# Patient Record
Sex: Male | Born: 1980
Health system: Southern US, Community
[De-identification: ages and names within clinical notes are randomized; demographics above are authoritative.]

## PROBLEM LIST (undated history)

## (undated) DIAGNOSIS — F329 Major depressive disorder, single episode, unspecified: Secondary | ICD-10-CM

## (undated) DIAGNOSIS — F32A Depression, unspecified: Secondary | ICD-10-CM

## (undated) HISTORY — DX: Major depressive disorder, single episode, unspecified: F32.9

## (undated) HISTORY — DX: Depression, unspecified: F32.A

---

## 2017-05-27 ENCOUNTER — Ambulatory Visit (INDEPENDENT_AMBULATORY_CARE_PROVIDER_SITE_OTHER): Payer: BLUE CROSS/BLUE SHIELD | Admitting: Family Medicine

## 2017-05-27 ENCOUNTER — Ambulatory Visit (INDEPENDENT_AMBULATORY_CARE_PROVIDER_SITE_OTHER): Payer: BLUE CROSS/BLUE SHIELD

## 2017-05-27 ENCOUNTER — Encounter: Payer: Self-pay | Admitting: Family Medicine

## 2017-05-27 VITALS — BP 120/90 | HR 72 | Temp 97.9°F | Ht 69.0 in | Wt 162.0 lb

## 2017-05-27 DIAGNOSIS — M545 Low back pain, unspecified: Secondary | ICD-10-CM

## 2017-05-27 DIAGNOSIS — F32A Depression, unspecified: Secondary | ICD-10-CM

## 2017-05-27 DIAGNOSIS — R5383 Other fatigue: Secondary | ICD-10-CM | POA: Diagnosis not present

## 2017-05-27 DIAGNOSIS — F329 Major depressive disorder, single episode, unspecified: Secondary | ICD-10-CM

## 2017-05-27 DIAGNOSIS — G8929 Other chronic pain: Secondary | ICD-10-CM

## 2017-05-27 DIAGNOSIS — F419 Anxiety disorder, unspecified: Secondary | ICD-10-CM

## 2017-05-27 DIAGNOSIS — M25561 Pain in right knee: Secondary | ICD-10-CM

## 2017-05-27 LAB — CBC WITH DIFFERENTIAL/PLATELET
BASOS PCT: 0.4 % (ref 0.0–3.0)
Basophils Absolute: 0 10*3/uL (ref 0.0–0.1)
EOS ABS: 0.1 10*3/uL (ref 0.0–0.7)
EOS PCT: 1.8 % (ref 0.0–5.0)
HEMATOCRIT: 46.8 % (ref 39.0–52.0)
HEMOGLOBIN: 15.6 g/dL (ref 13.0–17.0)
LYMPHS PCT: 24.8 % (ref 12.0–46.0)
Lymphs Abs: 1.9 10*3/uL (ref 0.7–4.0)
MCHC: 33.3 g/dL (ref 30.0–36.0)
MCV: 92 fl (ref 78.0–100.0)
MONOS PCT: 5.8 % (ref 3.0–12.0)
Monocytes Absolute: 0.4 10*3/uL (ref 0.1–1.0)
Neutro Abs: 5.1 10*3/uL (ref 1.4–7.7)
Neutrophils Relative %: 67.2 % (ref 43.0–77.0)
Platelets: 325 10*3/uL (ref 150.0–400.0)
RBC: 5.09 Mil/uL (ref 4.22–5.81)
RDW: 12.9 % (ref 11.5–15.5)
WBC: 7.6 10*3/uL (ref 4.0–10.5)

## 2017-05-27 LAB — COMPREHENSIVE METABOLIC PANEL
ALBUMIN: 4.5 g/dL (ref 3.5–5.2)
ALT: 28 U/L (ref 0–53)
AST: 19 U/L (ref 0–37)
Alkaline Phosphatase: 56 U/L (ref 39–117)
BUN: 19 mg/dL (ref 6–23)
CALCIUM: 10.1 mg/dL (ref 8.4–10.5)
CHLORIDE: 103 meq/L (ref 96–112)
CO2: 34 mEq/L — ABNORMAL HIGH (ref 19–32)
CREATININE: 1.22 mg/dL (ref 0.40–1.50)
GFR: 72 mL/min (ref >60.00)
Glucose, Bld: 103 mg/dL — ABNORMAL HIGH (ref 70–99)
POTASSIUM: 4.3 meq/L (ref 3.5–5.1)
Sodium: 139 mEq/L (ref 135–145)
Total Bilirubin: 0.5 mg/dL (ref 0.2–1.2)
Total Protein: 7.6 g/dL (ref 6.0–8.3)

## 2017-05-27 LAB — VITAMIN B12: VITAMIN B 12: 442 pg/mL (ref 211–911)

## 2017-05-27 LAB — TSH: TSH: 1.01 u[IU]/mL (ref 0.35–4.50)

## 2017-05-27 LAB — VITAMIN D 25 HYDROXY (VIT D DEFICIENCY, FRACTURES): VITD: 27.42 ng/mL — ABNORMAL LOW (ref 30.00–100.00)

## 2017-05-27 NOTE — Patient Instructions (Signed)
Nice to see you. We'll get some lab work today and an x-ray and call you with results. We'll get you set up with physical therapy. We'll get you set up with the therapist. Please try over-the-counter melatonin 0.5 mg nightly 30 minutes before going to bed to see if that helps with your sleep.

## 2017-05-27 NOTE — Progress Notes (Signed)
Tommi Rumps, MD Phone: [phone]  Casey Boyd is a 36 y.o. male who presents today for new patient exam.  Anxiety: Patient notes quite a bit of anxiety. He notes some PTSD and depression as well. He was in the [employer]. He feels like he is in a fog daily. Has trouble sleeping. Not as much energy as he used to have. He has trouble falling asleep at night. No snoring or apnea. He occasionally has nightmares and terrors and sometimes they're related to [employer] activities though other times can be randomly related to aliens or something equally as strange. He's tried exercising and eating right to see if that would help. He notes that has not been beneficial. No SI or HI.  Low back pain: This is a chronic issue. He has seen a chiropractor in the past. Radiate some down his legs in the right lateral aspect of his right leg. Notes a little bit of numbness in the similar distribution. No incontinence, saddle anesthesia, or weakness. When he stretches this improves.  Right knee pain: Patient notes for some time now this will hurt when he bends it and places weight on it. It'll occasionally buckle underneath him. He notes no pain at any other time. No known injury.  Active Ambulatory Problems    Diagnosis Date Noted  . Anxiety and depression 05/28/2017  . Chronic low back pain 05/28/2017  . Right knee pain 05/28/2017   Resolved Ambulatory Problems    Diagnosis Date Noted  . No Resolved Ambulatory Problems   Past Medical History:  Diagnosis Date  . Depression     Family History  Problem Relation Age of Onset  . Drug abuse Mother   . Alcohol abuse Mother   . Arthritis Father   . Hyperlipidemia Father   . Hypertension Father   . Arthritis Maternal Grandmother   . Ovarian cancer Maternal Grandmother   . Arthritis Maternal Grandfather   . Hyperlipidemia Maternal Grandfather   . Heart disease Maternal Grandfather   . Stroke Maternal Grandfather   . Breast cancer Paternal Grandmother     . Stroke Paternal Grandfather   . Heart attack Paternal Grandfather     Social History   Social History  . Marital status: Married    Spouse name: N/A  . Number of children: N/A  . Years of education: N/A   Occupational History  . Not on file.   Social History Main Topics  . Smoking status: Never Smoker  . Smokeless tobacco: Never Used  . Alcohol use No  . Drug use: No  . Sexual activity: Not on file   Other Topics Concern  . Not on file   Social History Narrative  . No narrative on file    ROS  General:  Negative for nexplained weight loss, fever Skin: Negative for new or changing mole, sore that won't heal HEENT: Negative for trouble hearing, trouble seeing, ringing in ears, mouth sores, hoarseness, change in voice, dysphagia. CV:  Negative for chest pain, dyspnea, edema, palpitations Resp: Negative for cough, dyspnea, hemoptysis GI: Negative for nausea, vomiting, diarrhea, constipation, abdominal pain, melena, hematochezia. GU: Negative for dysuria, incontinence, urinary hesitance, hematuria, vaginal or penile discharge, polyuria, sexual difficulty, lumps in testicle or breasts MSK: Negative for muscle cramps or aches,Positive for joint pain or swelling Neuro: Negative for headaches, weakness, numbness, dizziness, passing out/fainting Psych: Positive for anxiety, Negative for depression, memory problems  Objective  Physical Exam Vitals:   05/27/17 1343  BP: 120/90  Pulse: 72  Temp: 97.9 F (36.6 C)  SpO2: 98%    BP Readings from Last 3 Encounters:  05/27/17 120/90   Wt Readings from Last 3 Encounters:  05/27/17 162 lb (73.5 kg)    Physical Exam  Constitutional: No distress.  HENT:  Head: Normocephalic and atraumatic.  Mouth/Throat: Oropharynx is clear and moist. No oropharyngeal exudate.  Eyes: Pupils are equal, round, and reactive to light. Conjunctivae are normal.  Cardiovascular: Normal rate, regular rhythm and normal heart sounds.    Pulmonary/Chest: Effort normal and breath sounds normal.  Abdominal: Soft. Bowel sounds are normal. He exhibits no distension. There is no tenderness. There is no rebound and no guarding.  Musculoskeletal: He exhibits no edema.  No midline spine tenderness, no midline spine step-off, no muscular back tenderness, right knee with no tenderness or swelling, no ligamentous laxity, negative McMurray's  Neurological: He is alert. Gait normal.  5/5 strength bilateral quads, hamstrings, plantar flexion, and dorsiflexion, sensation to light touch intact bilaterally lower extremities  Skin: Skin is warm and dry. He is not diaphoretic.  Psychiatric:  Mood anxious, affect normal     Assessment/Plan:   Anxiety and depression Patient with anxiety and depression. Also possibly PTSD. He does note some fatigue. Suspect this is related to his anxiety leading to sleep difficulty. We'll obtain lab work as outlined below. We'll refer to psychology. Discussed medication though he wanted to hold off on this. He will monitor.  Chronic low back pain Chronic issue. Obtain x-ray. Refer to physical therapy.  Right knee pain Chronic issue. Unsure of cause at this time. We'll obtain an x-ray and consider further evaluation based on this. Offered referral to orthopedics or sports medicine.   Orders Placed This Encounter  Procedures  . DG Lumbar Spine Complete    Standing Status:   Future    Number of Occurrences:   1    Standing Expiration Date:   07/27/2018    Order Specific Question:   Reason for Exam (SYMPTOM  OR DIAGNOSIS REQUIRED)    Answer:   chronic low back pain, radiation down right lateral leg, numbness intermittently in same distribution    Order Specific Question:   Preferred imaging location?    Answer:   [facility] Specific Question:   Radiology Contrast Protocol - do NOT remove file path    Answer:   \\[email or web address]\DXFluoroContrastProtocols.pdf  . DG Knee  Complete 4 Views Right    Standing Status:   Future    Number of Occurrences:   1    Standing Expiration Date:   07/27/2018    Order Specific Question:   Reason for Exam (SYMPTOM  OR DIAGNOSIS REQUIRED)    Answer:   Chronic intermittent right knee pain, feels as though it's giving out on him at times    Order Specific Question:   Preferred imaging location?    Answer:   [facility] Specific Question:   Radiology Contrast Protocol - do NOT remove file path    Answer:   \\[email or web address]\Radiant\DXFluoroContrastProtocols.pdf  . Comp Met (CMET)  . CBC w/Diff  . TSH  . B12  . Vitamin D (25 hydroxy)  . Ambulatory referral to Psychology    Referral Priority:   Routine    Referral Type:   Psychiatric    Referral Reason:   Specialty Services Required    Requested Specialty:   Psychology    Number of Visits Requested:   1  . Ambulatory  referral to Physical Therapy    Referral Priority:   Routine    Referral Type:   Physical Medicine    Referral Reason:   Specialty Services Required    Requested Specialty:   Physical Therapy    Number of Visits Requested:   1    No orders of the defined types were placed in this encounter.    Tommi Rumps, MD [place]

## 2017-05-28 DIAGNOSIS — G8929 Other chronic pain: Secondary | ICD-10-CM | POA: Insufficient documentation

## 2017-05-28 DIAGNOSIS — M545 Low back pain, unspecified: Secondary | ICD-10-CM | POA: Insufficient documentation

## 2017-05-28 DIAGNOSIS — F329 Major depressive disorder, single episode, unspecified: Secondary | ICD-10-CM | POA: Insufficient documentation

## 2017-05-28 DIAGNOSIS — F419 Anxiety disorder, unspecified: Secondary | ICD-10-CM

## 2017-05-28 DIAGNOSIS — M25561 Pain in right knee: Secondary | ICD-10-CM | POA: Insufficient documentation

## 2017-05-28 DIAGNOSIS — F32A Depression, unspecified: Secondary | ICD-10-CM | POA: Insufficient documentation

## 2017-05-28 NOTE — Assessment & Plan Note (Signed)
Chronic issue. Unsure of cause at this time. We'll obtain an x-ray and consider further evaluation based on this. Offered referral to orthopedics or sports medicine.

## 2017-05-28 NOTE — Assessment & Plan Note (Addendum)
Patient with anxiety and depression. Also possibly PTSD. He does note some fatigue. Suspect this is related to his anxiety leading to sleep difficulty. We'll obtain lab work as outlined below. We'll refer to psychology. Discussed medication though he wanted to hold off on this. He will monitor.

## 2017-05-28 NOTE — Assessment & Plan Note (Signed)
Chronic issue. Obtain x-ray. Refer to physical therapy.

## 2017-06-11 ENCOUNTER — Ambulatory Visit: Payer: BLUE CROSS/BLUE SHIELD | Attending: Family Medicine | Admitting: Physical Therapy

## 2017-06-11 DIAGNOSIS — M5442 Lumbago with sciatica, left side: Secondary | ICD-10-CM | POA: Insufficient documentation

## 2017-06-11 DIAGNOSIS — G8929 Other chronic pain: Secondary | ICD-10-CM

## 2017-06-11 NOTE — Patient Instructions (Addendum)
Trunk flexion - mild pain/tightness (same for extension)  L rotation feels mildly more limited than R  Side bending - WNL bilaterally   Hip flexion and knee extension 5/5 but felt some pain in the medial-anterior knee   SLR feels some pressure/pulling in posterior/superior hip (stabbing pain residually on R side)   L side (FADIR/FABER - WNL and no pain, ER/IR - no increased pain and appropriate ROM) -- HS 90-90 (felt some pain on the R side after)

## 2017-06-12 NOTE — Therapy (Signed)
[facility] MEDICINE [address], [place], [address] Phone: [phone]   Fax:  [phone]  Physical Therapy Evaluation  Patient Details  Name: Casey Boyd MRN: [id number] Date of Birth: Feb 24, 1981 Referring Provider: Dr. Birdie Sons  Encounter Date: 06/11/2017      PT End of Session - 06/12/17 1726    Visit Number 1   Number of Visits 9   Date for PT Re-Evaluation 08/07/17   PT Start Time 1600   PT Stop Time 1705   PT Time Calculation (min) 65 min   Activity Tolerance Patient tolerated treatment well   Behavior During Therapy [facility] for tasks assessed/performed      Past Medical History:  Diagnosis Date  . Depression     No past surgical history on file.  There were no vitals filed for this visit.       Subjective Assessment - 06/11/17 1617    Subjective Patient reports roughly 3-4 years ago he was lifting up an object and felt a pop in his lower back, he took the next few days easy and has had episodic L sided low back pain since that time. These flare ups do not seem to have definitive mechanisms, but he reports he recently had a prolonged drive up to [place], afterwards he had trouble with standing/walking for the whole week.  He denies any weight loss, fevers, or bowel bladder changes. He reports his job consists of a lot of sitting and using a computer monitor, he is a Research scientist (life sciences). Reports he does get some pain radiating to L anterior groin/thigh and into his L knee at times.    Limitations Sitting;Standing;Walking   Diagnostic tests None at this point    Patient Stated Goals To figure out why he's having pain/relieve said pain.    Currently in Pain? No/denies            [facility] PT Assessment - 06/12/17 1741      Assessment   Medical Diagnosis Chronic low back pain without sciatica   Referring Provider Dr. Birdie Sons     Precautions   Precautions None     Restrictions   Weight Bearing  Restrictions No     Balance Screen   Has the patient fallen in the past 6 months No     Home Environment   Living Environment Private residence     Prior Function   Level of Independence Independent   Vocation Full time employment   Vocation Requirements Sitting and travel   Leisure Plays with his kids     Cognition   Overall Cognitive Status Within Functional Limits for tasks assessed     Observation/Other Assessments   Modified [place] 42     Sensation   Light Touch Appears Intact      Trunk flexion - mild pain/tightness (same for extension)  L rotation feels mildly more limited than R  Side bending - WNL bilaterally   Hip flexion and knee extension 5/5 but felt some pain in the medial-anterior knee   SLR feels some pressure/pulling in posterior/superior hip (stabbing pain residually on R side)   L side (FADIR/FABER - WNL and no pain, ER/IR - no increased pain and appropriate ROM) -- HS 90-90 (felt some pain on the R side after)    Ely's positive on L, not on R, prone hip flexor stretch - pain on L more than R  Joint mobilizations throughout lumbar spine mildly tender, but not reproductive  of his symptoms   Palpation of gluteals and lumbar spine were not reproductive of his symptoms   TherEx Performed stretching of quadriceps in prone as well as prone hip flexor stretch by therapist x 8 minutes total, patient reported symptomatic recreation of his pain and demonstrated difficulty with standing afterwards.   Performed isometric L hip flexion in sitting x 10 repetitions for 3 sets of 3-5" with submaximal holds -- patient reported complete alleviation of the symptoms he had been feeling.   Educated patient to perform isometrics, then stretch, then isometrics for HEP.       Objective measurements completed on examination: See above findings.                  PT Education - 06/12/17 1733    Education provided Yes   Education Details Use isometrics  as needed for pain control, can use stretching in between periods of isometrics.    Person(s) Educated Patient   Methods Explanation;Demonstration;Handout   Comprehension Verbalized understanding;Returned demonstration             PT Long Term Goals - 06/12/17 1724      PT LONG TERM GOAL #1   Title Patient will report mODI of less than 30% disability to demonstrate improved tolerance for ADLs.    Baseline 42%   Time 6   Period Weeks   Status New   Target Date 07/24/17     PT LONG TERM GOAL #2   Title Patient will report worst pain of no more than 3/10 to demonstrate improved tolerance for ADLs.    Baseline 8/10   Time 6   Period Weeks   Status New   Target Date 07/24/17     PT LONG TERM GOAL #3   Title Patient will report no increase in pain with sitting for periods of at least 1 hour to demonstrate improved tolerance for ADLs.    Baseline Can get severe lasting pain with prolonged sitting like car rides.    Time 6   Period Weeks   Status New   Target Date 07/24/17                Plan - 06/12/17 1727    Clinical Impression Statement Patient presents with deficits in hip flexion and knee extensor mobility on L side relative to his R side, when stretched patient has reproduction of his symptoms and difficulty with standing/ambulating around clinic. Once provided with isometric hip flexion exercise in sitting, he reports significant diminishment of pain/symptoms. For now this appears to be dysfunction of the hip flexor/knee extensor bi-articular musculature, will address with stretching, modalities, and isometrics for pain control initially and progress with mobility and strength as indicated.    Clinical Presentation Unstable   Clinical Decision Making Moderate   Rehab Potential Good   Clinical Impairments Affecting Rehab Potential Prolonged time with this pain/condition, but young and motivated to improve    PT Frequency 2x / week   PT Duration 4 weeks   PT  Treatment/Interventions Dry needling;Manual techniques;Balance training;Therapeutic exercise;Therapeutic activities;Neuromuscular re-education;Gait training;Electrical Stimulation;Cryotherapy;Biofeedback;Traction;Moist Heat;Iontophoresis 4mg /ml Dexamethasone   PT Next Visit Plan Assess tolerance for hip flexor/knee extensor stretching. TDN if appropriate.    PT Home Exercise Plan Isometric hip flexion for pain control, ely's stretch/half kneeling hip flexor stretch in between bouts of isometric.   Consulted and Agree with Plan of Care Patient      Patient will benefit from skilled therapeutic intervention in order to improve the  following deficits and impairments:  Pain, Improper body mechanics, Decreased range of motion, Difficulty walking  Visit Diagnosis: Chronic left-sided low back pain with left-sided sciatica - Plan: PT plan of care cert/re-cert     Problem List Patient Active Problem List   Diagnosis Date Noted  . Anxiety and depression 05/28/2017  . Chronic low back pain 05/28/2017  . Right knee pain 05/28/2017    Alva Garnet PT, DPT, CSCS    06/12/2017, 5:43 PM  [facility] PHYSICAL AND SPORTS MEDICINE [address] [place], [address] Phone: [phone]   Fax:  [phone]  Name: Burdell Peed MRN: [id number] Date of Birth: 1981-08-22

## 2017-06-17 ENCOUNTER — Ambulatory Visit: Payer: BLUE CROSS/BLUE SHIELD | Admitting: Physical Therapy

## 2017-06-17 DIAGNOSIS — G8929 Other chronic pain: Secondary | ICD-10-CM | POA: Diagnosis not present

## 2017-06-17 DIAGNOSIS — M5442 Lumbago with sciatica, left side: Principal | ICD-10-CM

## 2017-06-17 NOTE — Therapy (Signed)
[facility]ORTS MEDICINE [address], [place], [address] Phone: [phone]   Fax:  [phone]  Physical Therapy Treatment  Patient Details  Name: Casey Boyd MRN: [id number] Date of Birth: [birth year] Referring Provider: Dr. Birdie Sons  Encounter Date: 06/17/2017      PT End of Session - 06/17/17 1710    Visit Number 2   Number of Visits 9   Date for PT Re-Evaluation 08/07/17   PT Start Time 1705   PT Stop Time 1729   PT Time Calculation (min) 24 min   Activity Tolerance Patient tolerated treatment well   Behavior During Therapy [facility] for tasks assessed/performed      Past Medical History:  Diagnosis Date  . Depression     No past surgical history on file.  There were no vitals filed for this visit.      Subjective Assessment - 06/17/17 1707    Subjective Patient reports no flare ups, and he believes he has generally improved in pain control. He has completed all of his exercises and found them beneficial, though he is not sure he is doing his quad stretch appropriately.    Limitations Sitting;Standing;Walking   Diagnostic tests None at this point    Patient Stated Goals To figure out why he's having pain/relieve said pain.    Currently in Pain? No/denies      R sided joint mobs UPAs (increased discomfort) x 5 bouts x 45-60" of grade I-II  Observed his technique with Ely's position stretch (required cuing to perform with legs closer to midline, belt on ankle not foot to limit turn out -- patient reported this felt much more beneficial  Sidelying rotational mobilization (felt good afterwards) x 30" for 5 bouts with PT providing overpressure and education on how to complete at home.                             PT Education - 06/17/17 1753    Education provided Yes   Education Details Follow up in 2 weeks, complete sidelying rotational mobilization in addition to HEP.    Person(s)  Educated Patient   Methods Explanation;Demonstration;Handout   Comprehension Returned demonstration;Verbalized understanding             PT Long Term Goals - 06/12/17 1724      PT LONG TERM GOAL #1   Title Patient will report mODI of less than 30% disability to demonstrate improved tolerance for ADLs.    Baseline 42%   Time 6   Period Weeks   Status New   Target Date 07/24/17     PT LONG TERM GOAL #2   Title Patient will report worst pain of no more than 3/10 to demonstrate improved tolerance for ADLs.    Baseline 8/10   Time 6   Period Weeks   Status New   Target Date 07/24/17     PT LONG TERM GOAL #3   Title Patient will report no increase in pain with sitting for periods of at least 1 hour to demonstrate improved tolerance for ADLs.    Baseline Can get severe lasting pain with prolonged sitting like car rides.    Time 6   Period Weeks   Status New   Target Date 07/24/17               Plan - 06/17/17 1725    Clinical Impression Statement Patient has  been improving steadily with no reported flare ups since prior session. Quad stretching still appears to be a beneficial exercise for him, though he required education on modification for this as he was performing with compensations at home. He appears to be making good progress, will follow up in 2 weeks.    Clinical Presentation Stable   Clinical Decision Making Moderate   Rehab Potential Good   Clinical Impairments Affecting Rehab Potential Prolonged time with this pain/condition, but young and motivated to improve    PT Frequency 2x / week   PT Duration 4 weeks   PT Treatment/Interventions Dry needling;Manual techniques;Balance training;Therapeutic exercise;Therapeutic activities;Neuromuscular re-education;Gait training;Electrical Stimulation;Cryotherapy;Biofeedback;Traction;Moist Heat;Iontophoresis 4mg /ml Dexamethasone   PT Next Visit Plan Assess tolerance for hip flexor/knee extensor stretching. TDN if  appropriate.    PT Home Exercise Plan Isometric hip flexion for pain control, ely's stretch/half kneeling hip flexor stretch in between bouts of isometric.   Consulted and Agree with Plan of Care Patient      Patient will benefit from skilled therapeutic intervention in order to improve the following deficits and impairments:  Pain, Improper body mechanics, Decreased range of motion, Difficulty walking  Visit Diagnosis: Chronic left-sided low back pain with left-sided sciatica     Problem List Patient Active Problem List   Diagnosis Date Noted  . Anxiety and depression 05/28/2017  . Chronic low back pain 05/28/2017  . Right knee pain 05/28/2017   Alva GarnetPatrick McNamara PT, DPT, CSCS    06/17/2017, 5:56 PM  [facility] [address] [place], [place], [id number] Phone: [phone]   Fax:  [phone]  Name: Casey Boyd MRN: [id number] Date of Birth: 12/15/1980

## 2017-06-17 NOTE — Patient Instructions (Signed)
R sided joint mobs UPAs (increased discomfort)  Observed his technique with Ely's position stretch (required cuing to perform with legs closer to midline, belt on ankle not foot to limit turn out   Sidelying rotational mobilization (felt good afterwards)

## 2017-06-24 ENCOUNTER — Ambulatory Visit: Payer: BLUE CROSS/BLUE SHIELD | Admitting: Physical Therapy

## 2017-06-26 ENCOUNTER — Ambulatory Visit: Payer: BLUE CROSS/BLUE SHIELD | Admitting: Physical Therapy

## 2017-06-26 DIAGNOSIS — M5442 Lumbago with sciatica, left side: Secondary | ICD-10-CM | POA: Diagnosis not present

## 2017-06-26 DIAGNOSIS — G8929 Other chronic pain: Secondary | ICD-10-CM | POA: Diagnosis not present

## 2017-06-26 NOTE — Therapy (Signed)
[facility]INE [address] [place], [address] Phone: [phone]   Fax:  [phone]  Physical Therapy Treatment  Patient Details  Name: Casey Boyd MRN: [id number] Date of Birth: 1981/09/25 Referring Provider: Dr. Birdie Sons  Encounter Date: 06/26/2017      PT End of Session - 06/26/17 1804    Visit Number 3   Number of Visits 9   Date for PT Re-Evaluation 08/07/17   PT Start Time 1705   PT Stop Time 1750   PT Time Calculation (min) 45 min   Activity Tolerance Patient tolerated treatment well   Behavior During Therapy [facility] for tasks assessed/performed      Past Medical History:  Diagnosis Date  . Depression     No past surgical history on file.  There were no vitals filed for this visit.      Subjective Assessment - 06/26/17 1709    Subjective Patient reports he has had increase in tightness/pain in his lower back which has not responded to stretching since last PT session. It has eventually started to calm down, but has been easily aggravated by any lifting activities.    Limitations Sitting;Standing;Walking   Diagnostic tests None at this point    Patient Stated Goals To figure out why he's having pain/relieve said pain.    Currently in Pain? No/denies        L rotation limited by ~33% and feels tight relative to R rotation   Flexion to the floor and extension mildly to moderately reproduces symptoms in distal R spine (lumbar area).   Standing cable rotations x 15 per side with 5# (mild aggravation going L to R)  With SLDL on L LE with medial rotations began to aggravate symptoms on R PSIS area   Performed TDN over L4/L5 and L5/S1 lumbar extensor musculature, felt significant spasming in the muscle, unable to get to lamina, pistoned x 15", patient reported relief with SLDL and medial rotation (not as sharp, more dull)   Patient performed standing lat and seated lat/tricep stretch, which  did not seem to specifically target his area of discomfort. Attempted at palpation of hip flexors anteriorly with no reported discomfort.   Educated patient on motor control exercises including standing fire hydrant isometrics x 15" per side, single leg bridging x 8 (initially painful on R side, but alleviated with repetitions), and standing isometric hip abduction x 15".   His position or stretch of comfort is seated with 1 leg off table, one leg on, rotatiing his shoulders medially                           PT Education - 06/26/17 1803    Education provided Yes   Education Details Appears he is getting spasming from poor motor control of rotational fibers of lumbar spine vs hip.    Person(s) Educated Patient   Methods Explanation;Demonstration;Handout   Comprehension Verbalized understanding;Returned demonstration             PT Long Term Goals - 06/12/17 1724      PT LONG TERM GOAL #1   Title Patient will report mODI of less than 30% disability to demonstrate improved tolerance for ADLs.    Baseline 42%   Time 6   Period Weeks   Status New   Target Date 07/24/17     PT LONG TERM GOAL #2   Title Patient will report worst pain  of no more than 3/10 to demonstrate improved tolerance for ADLs.    Baseline 8/10   Time 6   Period Weeks   Status New   Target Date 07/24/17     PT LONG TERM GOAL #3   Title Patient will report no increase in pain with sitting for periods of at least 1 hour to demonstrate improved tolerance for ADLs.    Baseline Can get severe lasting pain with prolonged sitting like car rides.    Time 6   Period Weeks   Status New   Target Date 07/24/17               Plan - 06/26/17 1804    Clinical Impression Statement Patient had acute flare up after last session and appears to have some difficulty with motor control with lumbo-pelvic rotations, as these consistently increase his R lumbar paraspinal/flank pain. Hip flexor  palpation and stretching did not seem to alleviate symptoms, and appears QL may be more responsible. He did respond well to TDN this date, with significant spasms noted. He may benefit from fine tuning motor control or lumbopelvic motor control.    Clinical Presentation Stable   Clinical Decision Making Moderate   Rehab Potential Good   Clinical Impairments Affecting Rehab Potential Prolonged time with this pain/condition, but young and motivated to improve    PT Frequency 2x / week   PT Duration 4 weeks   PT Treatment/Interventions Dry needling;Manual techniques;Balance training;Therapeutic exercise;Therapeutic activities;Neuromuscular re-education;Gait training;Electrical Stimulation;Cryotherapy;Biofeedback;Traction;Moist Heat;Iontophoresis /ml Dexamethasone   PT Next Visit Plan Assess tolerance for hip flexor/knee extensor stretching. TDN if appropriate.    PT Home Exercise Plan Isometric hip flexion for pain control, ely's stretch/half kneeling hip flexor stretch in between bouts of isometric.   Consulted and Agree with Plan of Care Patient      Patient will benefit from skilled therapeutic intervention in order to improve the following deficits and impairments:  Pain, Improper body mechanics, Decreased range of motion, Difficulty walking  Visit Diagnosis: Chronic left-sided low back pain with left-sided sciatica     Problem List Patient Active Problem List   Diagnosis Date Noted  . Anxiety and depression 05/28/2017  . Chronic low back pain 05/28/2017  . Right knee pain 05/28/2017   Alva Garnet PT, DPT, CSCS    06/26/2017, 6:35 PM  [facility] PHYSICAL AND SPORTS MEDICINE [address] [place], [address] Phone: [phone]   Fax:  [phone]  Name: Kelcy Baeten MRN: [id number] Date of Birth: 01-28-1981

## 2017-06-26 NOTE — Patient Instructions (Signed)
L rotation limited by ~33% and feels tight relative to R rotation   Flexion to the floor and extension mildly to moderately reproduces symptoms in distal R spine (lumbar area).   Standing cable rotations x 15 per side with 5# (mild aggravation going L to R)  With SLDL on L LE with medial rotations began to aggravate symptoms on R PSIS area   Performed TDN over L4/L5 and L5/S1 lumbar extensor musculature, felt significant spasming in the muscle, unable to get to lamina, pistoned x 15", patient reported relief with SLDL and medial rotation (not as sharp, more dull)   Patient performed standing lat and seated lat/tricep stretch, which did not seem to specifically target his area of discomfort. Attempted at palpation of hip flexors anteriorly with no reported discomfort.   Educated patient on motor control exercises including standing fire hydrant isometrics x 15" per side, single leg bridging x 8 (initially painful on R side, but alleviated with repetitions), and standing isometric hip abduction x 15".   His position or stretch of comfort is seated with 1 leg off table, one leg on, rotatiing his shoulders medially

## 2017-07-09 ENCOUNTER — Ambulatory Visit: Payer: BLUE CROSS/BLUE SHIELD | Attending: Family Medicine | Admitting: Physical Therapy

## 2017-09-10 ENCOUNTER — Ambulatory Visit: Payer: BLUE CROSS/BLUE SHIELD | Admitting: Family Medicine

## 2017-12-09 ENCOUNTER — Emergency Department: Payer: BLUE CROSS/BLUE SHIELD

## 2017-12-09 ENCOUNTER — Encounter: Payer: Self-pay | Admitting: Emergency Medicine

## 2017-12-09 ENCOUNTER — Ambulatory Visit: Payer: Self-pay | Admitting: *Deleted

## 2017-12-09 ENCOUNTER — Emergency Department
Admission: EM | Admit: 2017-12-09 | Discharge: 2017-12-09 | Disposition: A | Discharge status: Home / Self Care | Payer: BLUE CROSS/BLUE SHIELD | Attending: Emergency Medicine | Admitting: Emergency Medicine

## 2017-12-09 DIAGNOSIS — R202 Paresthesia of skin: Secondary | ICD-10-CM | POA: Insufficient documentation

## 2017-12-09 DIAGNOSIS — R200 Anesthesia of skin: Secondary | ICD-10-CM | POA: Diagnosis not present

## 2017-12-09 LAB — URINE DRUG SCREEN, QUALITATIVE (ARMC ONLY)
AMPHETAMINES, UR SCREEN: NOT DETECTED
BENZODIAZEPINE, UR SCRN: NOT DETECTED
Barbiturates, Ur Screen: NOT DETECTED
Cannabinoid 50 Ng, Ur ~~LOC~~: NOT DETECTED
Cocaine Metabolite,Ur ~~LOC~~: NOT DETECTED
MDMA (Ecstasy)Ur Screen: NOT DETECTED
Methadone Scn, Ur: NOT DETECTED
OPIATE, UR SCREEN: NOT DETECTED
Phencyclidine (PCP) Ur S: NOT DETECTED
Tricyclic, Ur Screen: NOT DETECTED

## 2017-12-09 LAB — URINALYSIS, COMPLETE (UACMP) WITH MICROSCOPIC
BACTERIA UA: NONE SEEN
Bilirubin Urine: NEGATIVE
Glucose, UA: NEGATIVE mg/dL
Hgb urine dipstick: NEGATIVE
Ketones, ur: NEGATIVE mg/dL
Leukocytes, UA: NEGATIVE
Nitrite: NEGATIVE
PH: 7 (ref 5.0–8.0)
Protein, ur: NEGATIVE mg/dL
RBC / HPF: NONE SEEN RBC/hpf (ref 0–5)
SPECIFIC GRAVITY, URINE: 1.016 (ref 1.005–1.030)
SQUAMOUS EPITHELIAL / LPF: NONE SEEN
WBC UA: NONE SEEN WBC/hpf (ref 0–5)

## 2017-12-09 LAB — CBC WITH DIFFERENTIAL/PLATELET
BASOS PCT: 1 %
Basophils Absolute: 0.1 10*3/uL (ref 0–0.1)
Eosinophils Absolute: 0.2 10*3/uL (ref 0–0.7)
Eosinophils Relative: 3 %
HEMATOCRIT: 45.8 % (ref 40.0–52.0)
HEMOGLOBIN: 15.5 g/dL (ref 13.0–18.0)
Lymphocytes Relative: 23 %
Lymphs Abs: 1.9 10*3/uL (ref 1.0–3.6)
MCH: 30.1 pg (ref 26.0–34.0)
MCHC: 33.8 g/dL (ref 32.0–36.0)
MCV: 88.9 fL (ref 80.0–100.0)
MONO ABS: 0.4 10*3/uL (ref 0.2–1.0)
Monocytes Relative: 5 %
NEUTROS ABS: 5.5 10*3/uL (ref 1.4–6.5)
NEUTROS PCT: 68 %
Platelets: 299 10*3/uL (ref 150–440)
RBC: 5.15 MIL/uL (ref 4.40–5.90)
RDW: 13.7 % (ref 11.5–14.5)
WBC: 8.2 10*3/uL (ref 3.8–10.6)

## 2017-12-09 LAB — BASIC METABOLIC PANEL
ANION GAP: 7 (ref 5–15)
BUN: 16 mg/dL (ref 6–20)
CALCIUM: 9.7 mg/dL (ref 8.9–10.3)
CO2: 30 mmol/L (ref 22–32)
Chloride: 104 mmol/L (ref 101–111)
Creatinine, Ser: 1.12 mg/dL (ref 0.61–1.24)
Glucose, Bld: 95 mg/dL (ref 65–99)
Potassium: 4.3 mmol/L (ref 3.5–5.1)
Sodium: 141 mmol/L (ref 135–145)

## 2017-12-09 LAB — MAGNESIUM: MAGNESIUM: 2.1 mg/dL (ref 1.7–2.4)

## 2017-12-09 LAB — PHOSPHORUS: PHOSPHORUS: 3.5 mg/dL (ref 2.5–4.6)

## 2017-12-09 NOTE — ED Triage Notes (Signed)
Pt presents with right sided facial numbness and tingling sensation lasted about 3-5 min but has resolved now. Denies any changes with speech or vision. NAD.

## 2017-12-09 NOTE — ED Notes (Signed)
Pt in MRI at this time 

## 2017-12-09 NOTE — Telephone Encounter (Signed)
Noted. It appears the patient is in the ED now.  

## 2017-12-09 NOTE — Discharge Instructions (Signed)
If you have any numbness or weakness, difficulty speaking or talking, trouble with concentration or if your worse in any way, return to the emergency department right away.  Otherwise, follow closely with primary care and neurology.  We are referring you to a neurologist, however, it is not mandatory that you see this neurologist.  He may also consider calling [facility] as well if they can get you in faster.

## 2017-12-09 NOTE — Telephone Encounter (Signed)
Patient is calling with concerns of right facial,arm and leg tingling that lasted 3-5 minutes today. Patient states he was sitting at his desk and this occurred which was very strange. He states he could feel pins and needle type tingling in his nose. He has never had this happen before. All other neurologic questions were negative- but because of the sudden onset and disappearance and the office was contacted. Per recommendation of flow coordinator- patient is advised to go to ED for evaluation. Call to patient and he is going to go.  Reason for Disposition . [1] Numbness (i.e., loss of sensation) of the face, arm / hand, or leg / foot on one side of the body AND [2] sudden onset AND [3] brief (now gone)  Answer Assessment - Initial Assessment Questions 1. SYMPTOM: "What is the main symptom you are concerned about?" (e.g., weakness, numbness)     Facial tingling on right 2. ONSET: "When did this start?" (minutes, hours, days; while sleeping)     1/2 hour ago 3. LAST NORMAL: "When was the last time you were normal (no symptoms)?"     Lasted 3-5 minutes 4. PATTERN "Does this come and go, or has it been constant since it started?"  "Is it present now?"     Only occurance 5. CARDIAC SYMPTOMS: "Have you had any of the following symptoms: chest pain, difficulty breathing, palpitations?"     no 6. NEUROLOGIC SYMPTOMS: "Have you had any of the following symptoms: headache, dizziness, vision loss, double vision, changes in speech, unsteady on your feet?"     no 7. OTHER SYMPTOMS: "Do you have any other symptoms?"     no 8. PREGNANCY: "Is there any chance you are pregnant?" "When was your last menstrual period?"     n/a  Protocols used: NEUROLOGIC DEFICIT-A-AH

## 2017-12-09 NOTE — ED Notes (Signed)
MD at bedside. 

## 2017-12-09 NOTE — ED Provider Notes (Addendum)
[facility] Emergency Department Provider Note  ____________________________________________   I have reviewed the triage vital signs and the nursing notes. Where available I have reviewed prior notes and, if possible and indicated, outside hospital notes.    HISTORY  Chief Complaint Numbness    HPI Casey Boyd is a 37 y.o. male who presents here today complaining of having a few minutes, possibly, of tingling on the right side of his body.  Included the right face right arm right leg.  He states he was sitting somewhat slumped over to the right in the chair he thought maybe he was just sitting awkwardly he stood up, the symptoms persisted for a few seconds or perhaps a minute after that and went away.  This was at 4:00.  He has had no symptoms since that time.  He has done multiple different Google searches on the Internet about mini strokes since this time.  CT scan from the waiting room is negative.  He has had no headache, no chest pain or shortness of breath no nausea vertigo symptoms or other complaints.  He denies being weak at all.  He is completely at his baseline at this time.  He is a young healthy 37 year old male who exercises, no recent trauma no history of drug abuse and he states "I freaked out" after he had this tingling.     Past Medical History:  Diagnosis Date  . Depression     Patient Active Problem List   Diagnosis Date Noted  . Anxiety and depression 05/28/2017  . Chronic low back pain 05/28/2017  . Right knee pain 05/28/2017    History reviewed. No pertinent surgical history.  Prior to Admission medications   Not on File    Allergies Patient has no known allergies.  Family History  Problem Relation Age of Onset  . Drug abuse Mother   . Alcohol abuse Mother   . Arthritis Father   . Hyperlipidemia Father   . Hypertension Father   . Arthritis Maternal Grandmother   . Ovarian cancer Maternal Grandmother   . Arthritis  Maternal Grandfather   . Hyperlipidemia Maternal Grandfather   . Heart disease Maternal Grandfather   . Stroke Maternal Grandfather   . Breast cancer Paternal Grandmother   . Stroke Paternal Grandfather   . Heart attack Paternal Grandfather     Social History Social History   Tobacco Use  . Smoking status: Never Smoker  . Smokeless tobacco: Never Used  Substance Use Topics  . Alcohol use: No  . Drug use: No    Review of Systems Constitutional: No fever/chills Eyes: No visual changes. ENT: No sore throat. No stiff neck no neck pain Cardiovascular: Denies chest pain. Respiratory: Denies shortness of breath. Gastrointestinal:   no vomiting.  No diarrhea.  No constipation. Genitourinary: Negative for dysuria. Musculoskeletal: Negative lower extremity swelling Skin: Negative for rash. Neurological: Negative for severe headaches, focal weakness    ____________________________________________   PHYSICAL EXAM:  VITAL SIGNS: ED Triage Vitals  Enc Vitals Group     BP 12/09/17 1727 (!) 149/95     Pulse Rate 12/09/17 1727 65     Resp 12/09/17 1727 20     Temp 12/09/17 1727 98.5 F (36.9 C)     Temp Source 12/09/17 1727 Oral     SpO2 12/09/17 1727 99 %     Weight 12/09/17 1730 162 lb (73.5 kg)     Height --      Head Circumference --  Peak Flow --      Pain Score --      Pain Loc --      Pain Edu? --      Excl. in GC? --     Constitutional: Alert and oriented. Well appearing and in no acute distress. Eyes: Conjunctivae are normal Head: Atraumatic HEENT: No congestion/rhinnorhea. Mucous membranes are moist.  Oropharynx non-erythematous Neck:   Nontender with no meningismus, no masses, no stridor Cardiovascular: Normal rate, regular rhythm. Grossly normal heart sounds.  Good peripheral circulation. Respiratory: Normal respiratory effort.  No retractions. Lungs CTAB. Abdominal: Soft and nontender. No distention. No guarding no rebound Back:  There is no focal  tenderness or step off.  there is no midline tenderness there are no lesions noted. there is no CVA tenderness Musculoskeletal: No lower extremity tenderness, no upper extremity tenderness. No joint effusions, no DVT signs strong distal pulses no edema Neurologic: Cranial nerves II through XII are grossly intact 5 out of 5 strength bilateral upper and lower extremity. Finger to nose within normal limits heel to shin within normal limits, speech is normal with no word finding difficulty or dysarthria, reflexes symmetric, pupils are equally round and reactive to light, there is no pronator drift, sensation is normal, vision is intact to confrontation, gait is deferred, there is no nystagmus, normal neurologic exam.  Skin:  Skin is warm, dry and intact. No rash noted. Psychiatric: Mood and affect are anxious. Speech and behavior are normal.  ____________________________________________   LABS (all labs ordered are listed, but only abnormal results are displayed)  Labs Reviewed - No data to display  Pertinent labs  results that were available during my care of the patient were reviewed by me and considered in my medical decision making (see chart for details). ____________________________________________  EKG  I personally interpreted any EKGs ordered by me or triage Normal sinus rhythm, rate 74 bpm no acute ischemic changes ____________________________________________  RADIOLOGY  Pertinent labs & imaging results that were available during my care of the patient were reviewed by me and considered in my medical decision making (see chart for details). If possible, patient and/or family made aware of any abnormal findings.  Ct Head Wo Contrast  Result Date: 12/09/2017 CLINICAL DATA:  Numbness or tingling in the right face, now resolved. No reported injury. EXAM: CT HEAD WITHOUT CONTRAST TECHNIQUE: Contiguous axial images were obtained from the base of the skull through the vertex without  intravenous contrast. COMPARISON:  None. FINDINGS: Brain: No evidence of parenchymal hemorrhage or extra-axial fluid collection. No mass lesion, mass effect, or midline shift. No CT evidence of acute infarction. Cerebral volume is age appropriate. No ventriculomegaly. Vascular: No acute abnormality. Skull: No evidence of calvarial fracture. Sinuses/Orbits: The visualized paranasal sinuses are essentially clear. Other:  The mastoid air cells are unopacified. IMPRESSION: Negative head CT.  No evidence of acute intracranial abnormality. Electronically Signed   By: Delbert Phenix M.D.   On: 12/09/2017 17:54   ______________________________________  Personally reviewed CT scan.  ______    PROCEDURES  Procedure(s) performed: None  Procedures  Critical Care performed: None  ____________________________________________   INITIAL IMPRESSION / ASSESSMENT AND PLAN / ED COURSE  Pertinent labs & imaging results that were available during my care of the patient were reviewed by me and considered in my medical decision making (see chart for details).  Patient here with concern for a very brief episode of right-sided tingling, differential includes partial seizure, TIA, anxiety positional issue, etc.  I did discuss with Dr. Jerrell Belfast, of neurology.  He does recommend a MRI and if negative he feels the patient can safely go home with close outpatient neurologic follow-up.  He also like blood work.  This is not unreasonable.  CT is negative and my anticipation is that the MRI will be - as well.  Serial neurologic exams in the emergency department are reassuring.  Patient is very much in agreement with this plan   ----------------------------------------- 9:08 PM on 12/09/2017 -----------------------------------------  Patient in no acute distress, ambulating to the department neurologically intact awaiting MRI results if negative as hoped and expected, we will get him home with close outpatient follow-up with  neurology in the next few days.     ____________________________________________   FINAL CLINICAL IMPRESSION(S) / ED DIAGNOSES  Final diagnoses:  None      This chart was dictated using voice recognition software.  Despite best efforts to proofread,  errors can occur which can change meaning.      Jeanmarie Plant, MD 12/09/17 1933    Jeanmarie Plant, MD 12/09/17 2108

## 2017-12-09 NOTE — Telephone Encounter (Signed)
Patient going to ED for evaluation.

## 2017-12-09 NOTE — ED Notes (Signed)
Pt. Returned to tx. room in stable condition with no acute changes since departure from unit for scans.   

## 2018-03-10 DIAGNOSIS — M9904 Segmental and somatic dysfunction of sacral region: Secondary | ICD-10-CM | POA: Diagnosis not present

## 2018-03-10 DIAGNOSIS — M5127 Other intervertebral disc displacement, lumbosacral region: Secondary | ICD-10-CM | POA: Diagnosis not present

## 2018-03-10 DIAGNOSIS — M9903 Segmental and somatic dysfunction of lumbar region: Secondary | ICD-10-CM | POA: Diagnosis not present

## 2018-07-05 IMAGING — MR MR HEAD W/O CM
10 series · 48 of 48 positions shown · non-contrast
Comparison: 12/09/2017 CT head

CLINICAL DATA: 37 y/o M; right-sided facial numbness and tingling
lasting 3-5 minutes.

EXAM:
MRI HEAD WITHOUT CONTRAST
TECHNIQUE: Multiplanar, multiecho pulse sequences of the brain and surrounding
structures were obtained without intravenous contrast.

[Series 2: T1 · sagittal · 5.0mm · 0.45mm/px · 3 of 29 slices shown (1 of 2)]
[im 1/29]
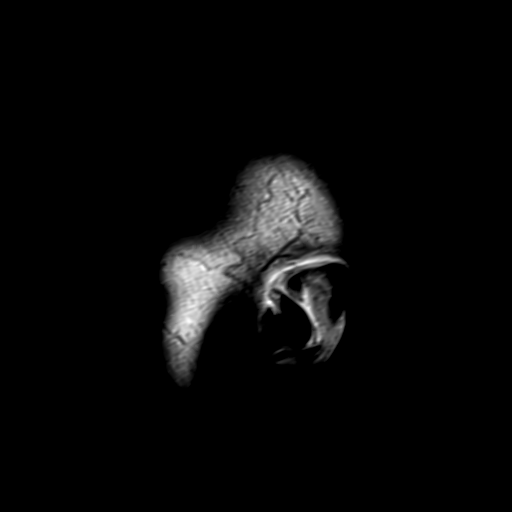
[im 15/29]
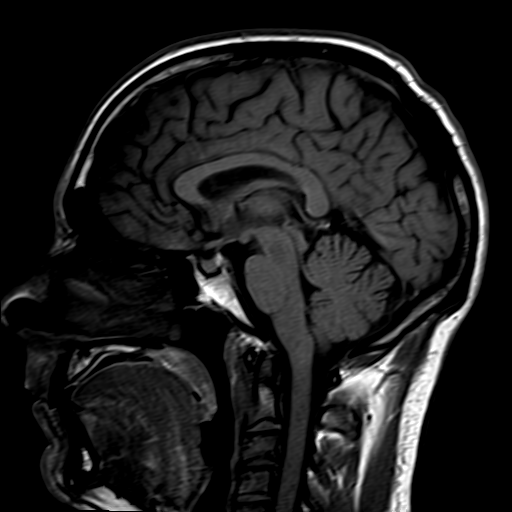
[im 29/29]
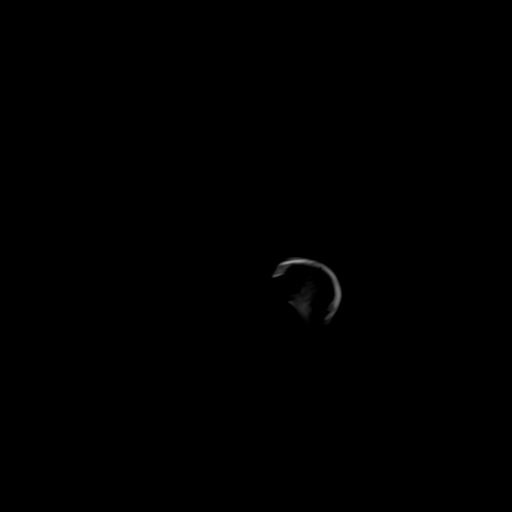

[Series 4: DWI · axial · 3.0mm · 1.80mm/px · z∈[-55,+106]mm · 4 of 50 slices shown (1 of 2)]
[im 1/50]
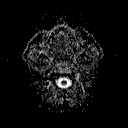
[im 17/50]
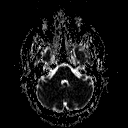
[im 33/50]
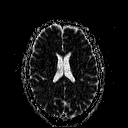
[im 50/50]
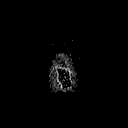

[Series 6: DWI · coronal · 3.0mm · 1.80mm/px · 4 of 48 slices shown (2 of 2)]
[im 1/48]
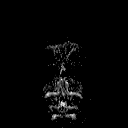
[im 16/48]
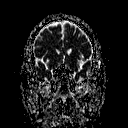
[im 32/48]
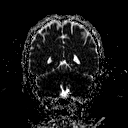
[im 48/48]
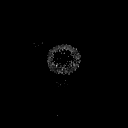

[Series 7: T2 · axial · 5.0mm · 0.60mm/px · z∈[-52,+103]mm · 2 of 25 slices shown (1 of 3)]
[im 1/25]
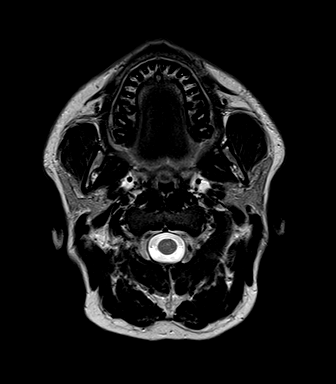
[im 25/25]
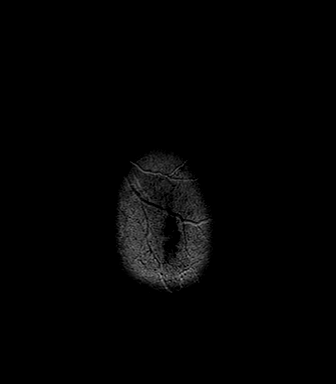

[Series 8: FLAIR · axial · 3.0mm · 0.45mm/px · z∈[-52,+103]mm · 5 of 53 slices shown]
[im 1/53]
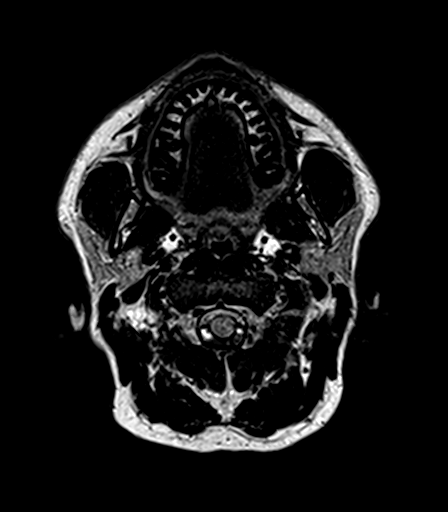
[im 14/53]
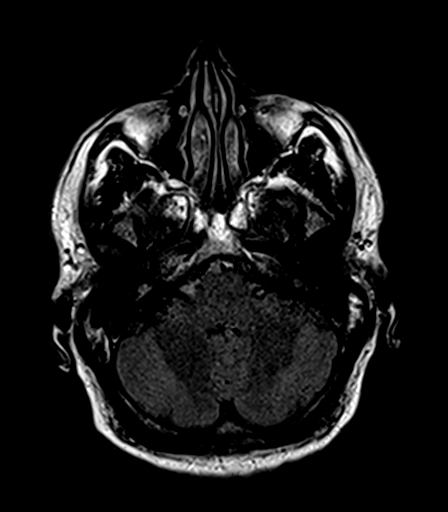
[im 27/53]
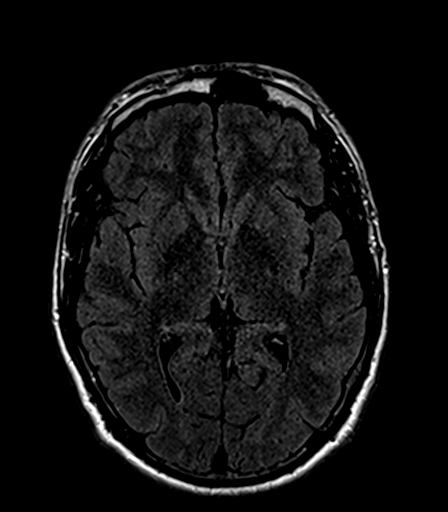
[im 40/53]
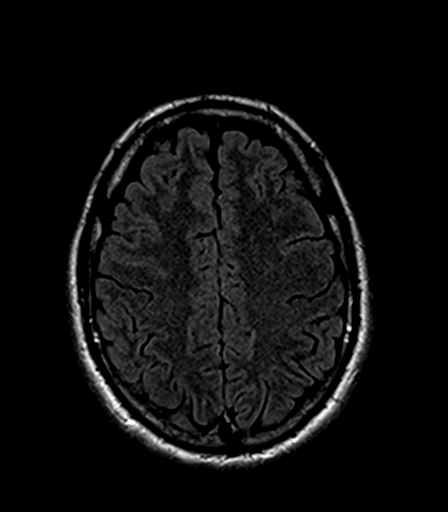
[im 53/53]
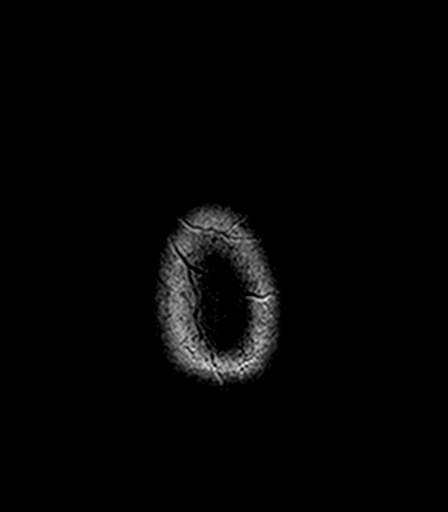

[Series 9: T2 · axial · 5.0mm · 0.45mm/px · z∈[-52,+103]mm · 2 of 25 slices shown (2 of 3)]
[im 1/25]
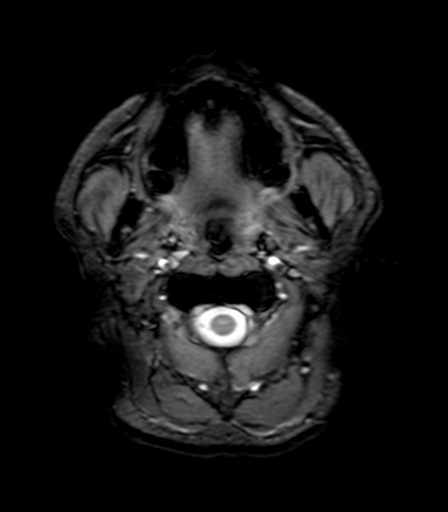
[im 25/25]
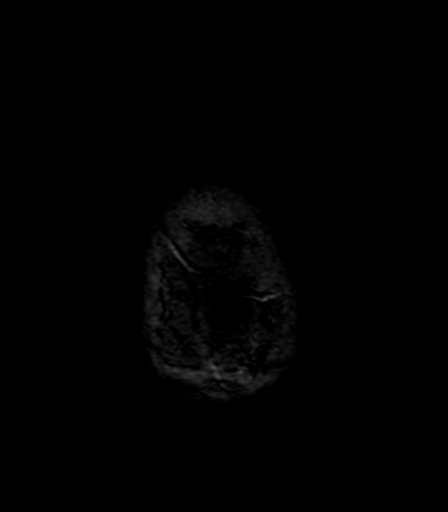

[Series 10: T1 · axial · 1.0mm · 0.77mm/px · z∈[-61,+113]mm · 16 of 176 slices shown (2 of 2)]
[im 1/176]
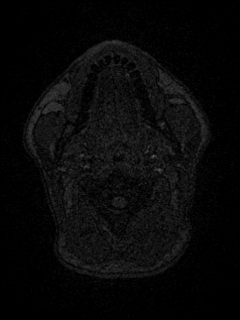
[im 12/176]
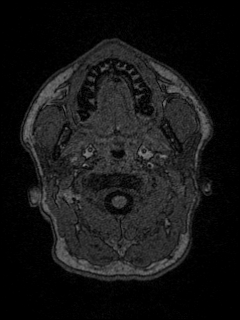
[im 24/176]
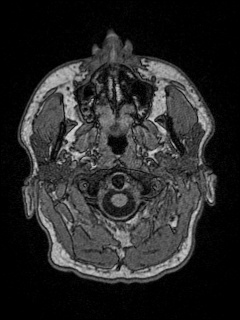
[im 36/176]
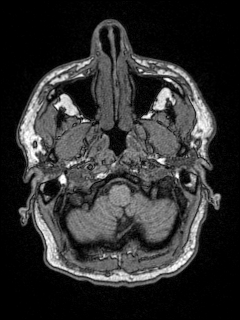
[im 47/176]
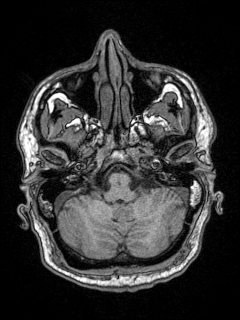
[im 59/176]
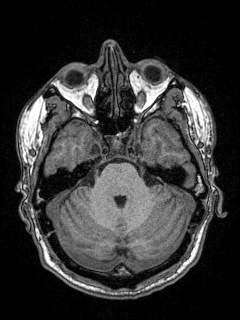
[im 71/176]
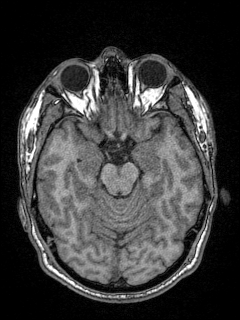
[im 82/176]
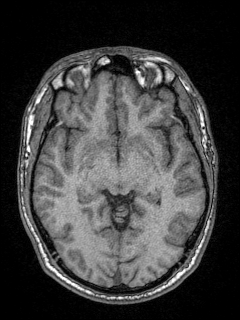
[im 94/176]
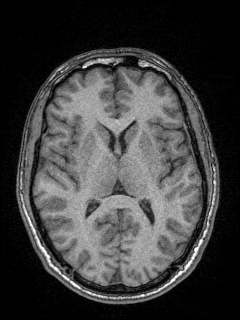
[im 106/176]
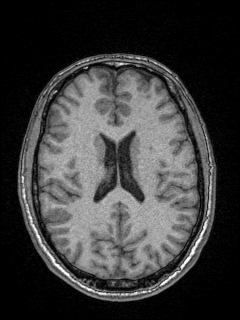
[im 117/176]
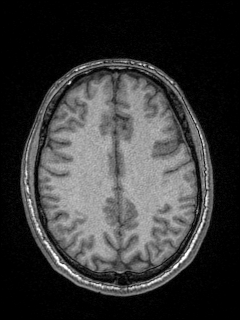
[im 129/176]
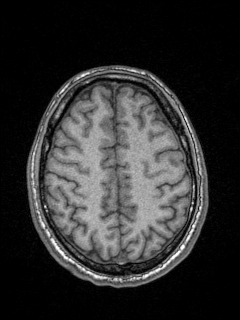
[im 141/176]
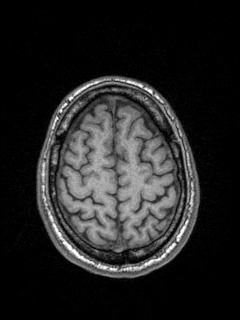
[im 152/176]
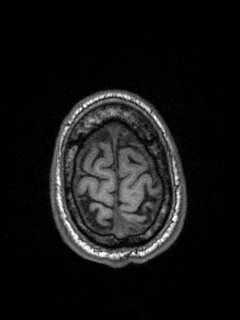
[im 164/176]
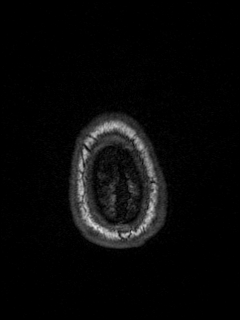
[im 176/176]
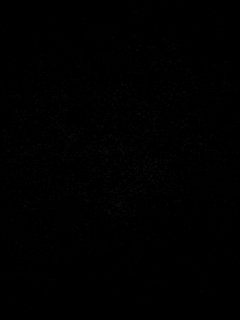

[Series 11: T2 · coronal · 5.0mm · 0.49mm/px · 3 of 31 slices shown (3 of 3)]
[im 1/31]
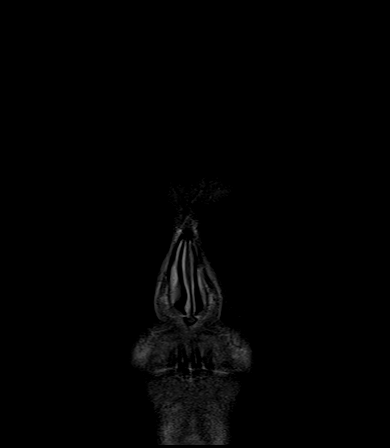
[im 16/31]
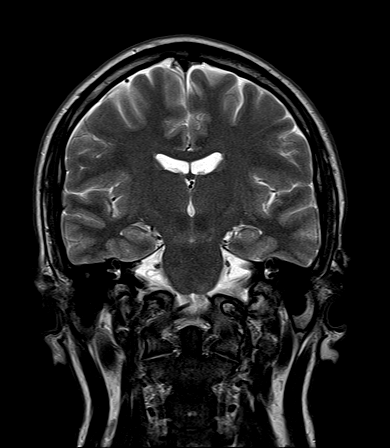
[im 31/31]
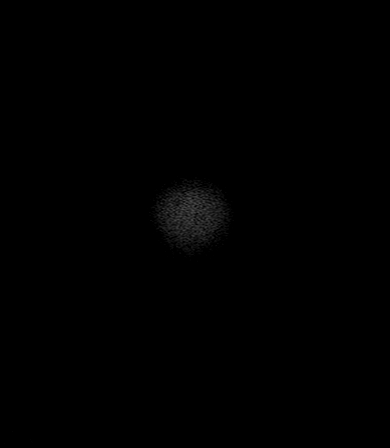

[Series 100: ax (id) · axial · 3.0mm · 1.80mm/px · z∈[-55,+106]mm · 5 of 55 slices shown]
[im 1/55]
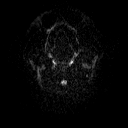
[im 14/55]
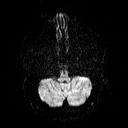
[im 28/55]
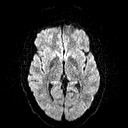
[im 41/55]
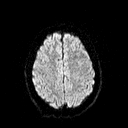
[im 55/55]
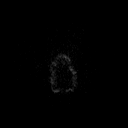

[Series 101: cor (id) · coronal · 3.0mm · 1.80mm/px · 4 of 49 slices shown]
[im 1/49]
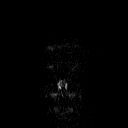
[im 17/49]
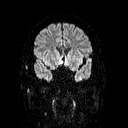
[im 33/49]
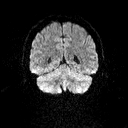
[im 49/49]
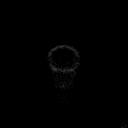

[48 of 48 positions shown; findings below may reference images not displayed]

FINDINGS: Brain: No acute infarction, hemorrhage, hydrocephalus, extra-axial
collection or mass lesion.

Vascular: Normal flow voids.

Skull and upper cervical spine: Normal marrow signal.

Sinuses/Orbits: Negative.

Other: None.
IMPRESSION: Normal MRI of the brain.

By: Rtoyota Joshjax M.D.

## 2019-03-15 ENCOUNTER — Telehealth: Payer: Self-pay | Admitting: Family Medicine

## 2019-03-15 NOTE — Telephone Encounter (Signed)
Appointment Request From: Casey Boyd  With Provider: Tommi Rumps, MD [facility]]  Preferred Date Range: 03/15/2019 - 03/19/2019  Preferred Times: Any time  Reason for visit: Office Visit  Comments: PTSD, Anxiety, Lowerback and right leg pain   --Lm twice to call off and schedule appt

## 2019-03-16 ENCOUNTER — Ambulatory Visit (INDEPENDENT_AMBULATORY_CARE_PROVIDER_SITE_OTHER): Payer: BLUE CROSS/BLUE SHIELD | Admitting: Family Medicine

## 2019-03-16 ENCOUNTER — Other Ambulatory Visit: Payer: Self-pay

## 2019-03-16 DIAGNOSIS — F329 Major depressive disorder, single episode, unspecified: Secondary | ICD-10-CM

## 2019-03-16 DIAGNOSIS — F32A Depression, unspecified: Secondary | ICD-10-CM

## 2019-03-16 DIAGNOSIS — G8929 Other chronic pain: Secondary | ICD-10-CM

## 2019-03-16 DIAGNOSIS — M545 Low back pain: Secondary | ICD-10-CM

## 2019-03-16 DIAGNOSIS — F419 Anxiety disorder, unspecified: Secondary | ICD-10-CM

## 2019-03-16 DIAGNOSIS — F4310 Post-traumatic stress disorder, unspecified: Secondary | ICD-10-CM

## 2019-03-16 MED ORDER — SERTRALINE HCL 50 MG PO TABS: 50.0000 mg | ORAL_TABLET | Freq: Every day | ORAL | 3 refills | Status: AC

## 2019-03-16 NOTE — Progress Notes (Signed)
Virtual Visit via video Note  This visit type was conducted due to national recommendations for restrictions regarding the COVID-19 pandemic (e.g. social distancing).  This format is felt to be most appropriate for this patient at this time.  All issues noted in this document were discussed and addressed.  No physical exam was performed (except for noted visual exam findings with Video Visits).   I connected with Casey Boyd today at  2:15 PM EDT by a video enabled telemedicine application and verified that I am speaking with the correct person using two identifiers. Location patient: hotel Location provider: work Persons participating in the virtual visit: patient, provider  I discussed the limitations, risks, security and privacy concerns of performing an evaluation and management service by telephone and the availability of in person appointments. I also discussed with the patient that there may be a patient responsible charge related to this service. The patient expressed understanding and agreed to proceed.   Reason for visit: Follow-up.  HPI: Depression/anxiety/PTSD: This issue seems to have worsened since last discussed.  He does note it waxes and wanes and at times he will be good for a period of time though if something happens that gets him off then he will have periods where this is worsened.  He does note anxiety and depression and feels he may have PTSD.  [place]  He does note waking up in the middle the night to the faintest noise and being hypervigilant when that occurs.  He does have flashbacks that do not occur daily.  The hypervigilance does occur daily.  He has very lucid dreams.  He notes all of this has been going on for several years.  He tries to avoid any stimuli that will bring this issue to the forefront of his mind.  He does note he has been drinking more than he should.  He notes typically there are 3 to 4 days a week where he will  drink and when asked about a quantity he notes "I can drink a lot."  He does note there are times where he will go several weeks without drinking anything.  He notes he does drink to dull the issues that he is having.  He notes when he has quit drinking in the past for several weeks he has had no withdrawal symptoms.  He has not done therapy or taken medication for this.  He has tried to work with the [facility] for these issues though notes he makes too much money to use their resources.  No SI.  Back pain/right hip and leg pain: Patient notes this has been going on for at least 4 to 5 years.  He has seen a chiropractor and had x-rays.  He wonders if he tore a muscle in his buttocks that is now pinching on his sciatic nerve.  He notes if he sits for a long period of time he has trouble getting up due to discomfort.  He does have shooting pain down the right lateral hip into the anterior portion of his right leg.  He has some numbness that comes and goes in the same distribution.  He does feel his right leg is somewhat weaker than previously.  No incontinence or saddle anesthesia.   ROS: See pertinent positives and negatives per HPI.  Past Medical History:  Diagnosis Date  . Depression     No past surgical history on file.  Family History  Problem Relation  Age of Onset  . Drug abuse Mother   . Alcohol abuse Mother   . Arthritis Father   . Hyperlipidemia Father   . Hypertension Father   . Arthritis Maternal Grandmother   . Ovarian cancer Maternal Grandmother   . Arthritis Maternal Grandfather   . Hyperlipidemia Maternal Grandfather   . Heart disease Maternal Grandfather   . Stroke Maternal Grandfather   . Breast cancer Paternal Grandmother   . Stroke Paternal Grandfather   . Heart attack Paternal Grandfather     SOCIAL HX: Non-smoker   Current Outpatient Medications:  .  sertraline (ZOLOFT) 50 MG tablet, Take 1 tablet (50 mg total) by mouth daily., Disp: 30 tablet, Rfl: 3  EXAM:   VITALS per patient if applicable: None.  GENERAL: alert, oriented, appears well and in no acute distress  HEENT: atraumatic, conjunttiva clear, no obvious abnormalities on inspection of external nose and ears  NECK: normal movements of the head and neck  LUNGS: on inspection no signs of respiratory distress, breathing rate appears normal, no obvious gross SOB, gasping or wheezing  CV: no obvious cyanosis  MS: moves all visible extremities without noticeable abnormality  PSYCH/NEURO: pleasant and cooperative, speech and thought processing grossly intact  ASSESSMENT AND PLAN:  Discussed the following assessment and plan:  Anxiety and depression - Plan: Ambulatory referral to Psychology  PTSD (post-traumatic stress disorder) - Plan: Ambulatory referral to Psychology  Chronic bilateral low back pain without sciatica - Plan: Ambulatory referral to Sports Medicine  Anxiety and depression Patient continues to have issues with this.  This issue appears to have worsened.  Discussed referral to therapy and starting on Zoloft.  We will follow-up with him in 1 to 2 months.  Discussed that it may take 1 to 2 months for the Zoloft to start to make a difference.  I did discuss decreasing his alcohol intake as that is likely contributing to his symptoms as well.  Advised him not to discontinue alcohol use all at once given the risk of withdrawal.  PTSD (post-traumatic stress disorder) Symptoms seem very consistent with PTSD.  We will refer him for therapy.  We will start him on Zoloft.  Chronic low back pain His current issues could represent nerve impingement in his back or more distally.  I discussed that he would likely need an MRI though he is hesitant regarding this due to potential cost issues.  I discussed referral to sports medicine and this was placed.  Given return precautions.  [facility] office staff will contact the patient to schedule him for follow-up in 1 month.  Social distancing  precautions and sick precautions given regarding COVID-19.   I discussed the assessment and treatment plan with the patient. The patient was provided an opportunity to ask questions and all were answered. The patient agreed with the plan and demonstrated an understanding of the instructions.   The patient was advised to call back or seek an in-person evaluation if the symptoms worsen or if the condition fails to improve as anticipated.  Tommi Rumps, MD

## 2019-03-16 NOTE — Assessment & Plan Note (Signed)
Symptoms seem very consistent with PTSD.  We will refer him for therapy.  We will start him on Zoloft.

## 2019-03-16 NOTE — Assessment & Plan Note (Signed)
His current issues could represent nerve impingement in his back or more distally.  I discussed that he would likely need an MRI though he is hesitant regarding this due to potential cost issues.  I discussed referral to sports medicine and this was placed.  Given return precautions.

## 2019-03-16 NOTE — Assessment & Plan Note (Addendum)
Patient continues to have issues with this.  This issue appears to have worsened.  Discussed referral to therapy and starting on Zoloft.  We will follow-up with him in 1 to 2 months.  Discussed that it may take 1 to 2 months for the Zoloft to start to make a difference.  I did discuss decreasing his alcohol intake as that is likely contributing to his symptoms as well.  Advised him not to discontinue alcohol use all at once given the risk of withdrawal.

## 2019-03-23 ENCOUNTER — Encounter: Payer: Self-pay | Admitting: Family Medicine

## 2019-03-26 ENCOUNTER — Ambulatory Visit: Payer: Self-pay | Admitting: Psychology

## 2024-09-15 ENCOUNTER — Other Ambulatory Visit: Payer: Self-pay | Admitting: Medical Genetics

## 2024-10-21 ENCOUNTER — Other Ambulatory Visit: Payer: Self-pay | Admitting: Medical Genetics

## 2024-10-21 DIAGNOSIS — Z006 Encounter for examination for normal comparison and control in clinical research program: Secondary | ICD-10-CM
